# Patient Record
Sex: Male | Born: 1995 | Race: White | Marital: Single | Smoking: Never smoker
Health system: Southern US, Community
[De-identification: ages and names within clinical notes are randomized; demographics above are authoritative.]

---

## 2015-10-03 ENCOUNTER — Emergency Department
Admission: EM | Admit: 2015-10-03 | Discharge: 2015-10-03 | Disposition: A | Discharge status: Home / Self Care | Payer: Managed Care, Other (non HMO) | Attending: Emergency Medicine | Admitting: Emergency Medicine

## 2015-10-03 ENCOUNTER — Encounter: Payer: Self-pay | Admitting: Emergency Medicine

## 2015-10-03 ENCOUNTER — Emergency Department: Payer: Managed Care, Other (non HMO)

## 2015-10-03 DIAGNOSIS — S9002XA Contusion of left ankle, initial encounter: Secondary | ICD-10-CM | POA: Diagnosis not present

## 2015-10-03 DIAGNOSIS — W2103XA Struck by baseball, initial encounter: Secondary | ICD-10-CM | POA: Diagnosis not present

## 2015-10-03 DIAGNOSIS — S99912A Unspecified injury of left ankle, initial encounter: Secondary | ICD-10-CM | POA: Diagnosis present

## 2015-10-03 DIAGNOSIS — Y92320 Baseball field as the place of occurrence of the external cause: Secondary | ICD-10-CM | POA: Insufficient documentation

## 2015-10-03 DIAGNOSIS — Y9364 Activity, baseball: Secondary | ICD-10-CM | POA: Diagnosis not present

## 2015-10-03 DIAGNOSIS — Z880 Allergy status to penicillin: Secondary | ICD-10-CM | POA: Diagnosis not present

## 2015-10-03 DIAGNOSIS — Y998 Other external cause status: Secondary | ICD-10-CM | POA: Diagnosis not present

## 2015-10-03 MED ORDER — HYDROCODONE-ACETAMINOPHEN 5-325 MG PO TABS: 1.0000 | ORAL_TABLET | ORAL | Status: AC | PRN

## 2015-10-03 MED ORDER — IBUPROFEN 600 MG PO TABS
600.0000 mg | ORAL_TABLET | Freq: Three times a day (TID) | ORAL | Status: AC | PRN
Start: 2015-10-03 — End: ?

## 2015-10-03 NOTE — ED Provider Notes (Signed)
[facility] Emergency Department Provider Note ____________________________________________  Time seen: Approximately 7:24 PM  I have reviewed the triage vital signs and the nursing notes.   HISTORY  Chief Complaint Ankle Pain   HPI Patrick Erickson is a 20 y.o. male is here with complaint of left ankle pain. Patient states that he was hit in the ankle by a thrown baseball at approximately 2 PM and has had pain since that time. He did take ibuprofen prior to his arrival in the emergency room. Currently his pain is 2 out of 10. This was during a [employer] baseball game.   History reviewed. No pertinent past medical history.  There are no active problems to display for this patient.   History reviewed. No pertinent past surgical history.  Current Outpatient Rx  Name  Route  Sig  Dispense  Refill  . HYDROcodone-acetaminophen (NORCO/VICODIN) 5-325 MG tablet   Oral   Take 1 tablet by mouth every 4 (four) hours as needed for moderate pain.   12 tablet   0   . ibuprofen (ADVIL,MOTRIN) 600 MG tablet   Oral   Take 1 tablet (600 mg total) by mouth every 8 (eight) hours as needed.   30 tablet   0     Allergies Penicillins  No family history on file.  Social History Social History  Substance Use Topics  . Smoking status: Never Smoker   . Smokeless tobacco: None  . Alcohol Use: None    Review of Systems Constitutional: No fever/chills Cardiovascular: Denies chest pain. Respiratory: Denies shortness of breath. Musculoskeletal: Positive left ankle pain. Skin: Negative for rash. Neurological: Negative for  focal weakness or numbness.  10-point ROS otherwise negative.  ____________________________________________   PHYSICAL EXAM:  VITAL SIGNS: ED Triage Vitals  Enc Vitals Group     BP 10/03/15 1745 110/61 mmHg     Pulse Rate 10/03/15 1745 62     Resp 10/03/15 1745 18     Temp 10/03/15 1745 98.4 F (36.9 C)     Temp Source 10/03/15 1745 Oral      SpO2 10/03/15 1745 98 %     Weight 10/03/15 1745 140 lb (63.504 kg)     Height 10/03/15 1745 5\' 9"  (1.753 m)     Head Cir --      Peak Flow --      Pain Score 10/03/15 1747 2     Pain Loc --      Pain Edu? --      Excl. in GC? --     Constitutional: Alert and oriented. Well appearing and in no acute distress. Eyes: Conjunctivae are normal. PERRL. EOMI. Head: Atraumatic. Nose: No congestion/rhinnorhea. Neck: No stridor.   Cardiovascular: Normal rate, regular rhythm. Grossly normal heart sounds.  Good peripheral circulation. Respiratory: Normal respiratory effort.  No retractions. Lungs CTAB. Musculoskeletal left ankle there is soft tissue tenderness but no edema is appreciated. There is no ecchymosis. Range of motion is minimally restricted secondary to pain. Motor sensory function intact. Neurologic:  Normal speech and language. No gross focal neurologic deficits are appreciated. No gait instability. Skin:  Skin is warm, dry and intact. No rash noted. No erythema, ecchymosis, or abrasions were seen. Psychiatric: Mood and affect are normal. Speech and behavior are normal.  ____________________________________________   LABS (all labs ordered are listed, but only abnormal results are displayed)  Labs Reviewed - No data to display   RADIOLOGY Left ankle per radiologist is negative.  ____________________________________________  PROCEDURES  Procedure(s) performed: None  Critical Care performed: No  ____________________________________________   INITIAL IMPRESSION / ASSESSMENT AND PLAN / ED COURSE  Pertinent labs & imaging results that were available during my care of the patient were reviewed by me and considered in my medical decision making (see chart for details).  Ace wrap was applied and patient was given crutches. Patient was instructed to ice and elevate ankle and continue ibuprofen. He was given a limited amount of Norco if needed for severe  pain. ____________________________________________   FINAL CLINICAL IMPRESSION(S) / ED DIAGNOSES  Final diagnoses:  Contusion of left ankle, initial encounter      Tommi Rumps, PA-C 10/03/15 1943  Sharyn Creamer, MD 10/03/15 2358

## 2015-10-03 NOTE — ED Notes (Signed)
Hit in L ankle approx 2p with ball, pain since.

## 2015-10-03 NOTE — Discharge Instructions (Signed)
Contusion °A contusion is a deep bruise. Contusions happen when an injury causes bleeding under the skin. Symptoms of bruising include pain, swelling, and discolored skin. The skin may turn blue, purple, or yellow. °HOME CARE  °· Rest the injured area. °· If told, put ice on the injured area. °· Put ice in a plastic bag. °· Place a towel between your skin and the bag. °· Leave the ice on for 20 minutes, 2-3 times per day. °· If told, put light pressure (compression) on the injured area using an elastic bandage. Make sure the bandage is not too tight. Remove it and put it back on as told by your doctor. °· If possible, raise (elevate) the injured area above the level of your heart while you are sitting or lying down. °· Take over-the-counter and prescription medicines only as told by your doctor. °GET HELP IF: °· Your symptoms do not get better after several days of treatment. °· Your symptoms get worse. °· You have trouble moving the injured area. °GET HELP RIGHT AWAY IF:  °· You have very bad pain. °· You have a loss of feeling (numbness) in a hand or foot. °· Your hand or foot turns pale or cold. °  °This information is not intended to replace advice given to you by your health care provider. Make sure you discuss any questions you have with your health care provider. °  °Document Released: 12/28/2007 Document Revised: 04/01/2015 Document Reviewed: 11/26/2014 °Elsevier Interactive Patient Education ©2016 [employer] ° °Cryotherapy °Cryotherapy is when you put ice on your injury. Ice helps lessen pain and puffiness (swelling) after an injury. Ice works the best when you start using it in the first 24 to 48 hours after an injury. °HOME CARE °· Put a dry or damp towel between the ice pack and your skin. °· You may press gently on the ice pack. °· Leave the ice on for no more than 10 to 20 minutes at a time. °· Check your skin after 5 minutes to make sure your skin is okay. °· Rest at least 20 minutes between ice  pack uses. °· Stop using ice when your skin loses feeling (numbness). °· Do not use ice on someone who cannot tell you when it hurts. This includes small children and people with memory problems (dementia). °GET HELP RIGHT AWAY IF: °· You have white spots on your skin. °· Your skin turns blue or pale. °· Your skin feels waxy or hard. °· Your puffiness gets worse. °MAKE SURE YOU:  °· Understand these instructions. °· Will watch your condition. °· Will get help right away if you are not doing well or get worse. °  °This information is not intended to replace advice given to you by your health care provider. Make sure you discuss any questions you have with your health care provider. °  °Document Released: 12/28/2007 Document Revised: 10/03/2011 Document Reviewed: 03/03/2011 °Elsevier Interactive Patient Education ©2016 [employer] ° °

## 2016-12-30 IMAGING — CR DG ANKLE COMPLETE 3+V*L*
3 series · 3 of 3 positions shown · non-contrast
Comparison: None.

CLINICAL DATA: Ankle injury playing baseball earlier today. Hit and
ankle by a fast ball. Initial encounter.

EXAM:
LEFT ANKLE COMPLETE - 3+ VIEW

[ankle ap]
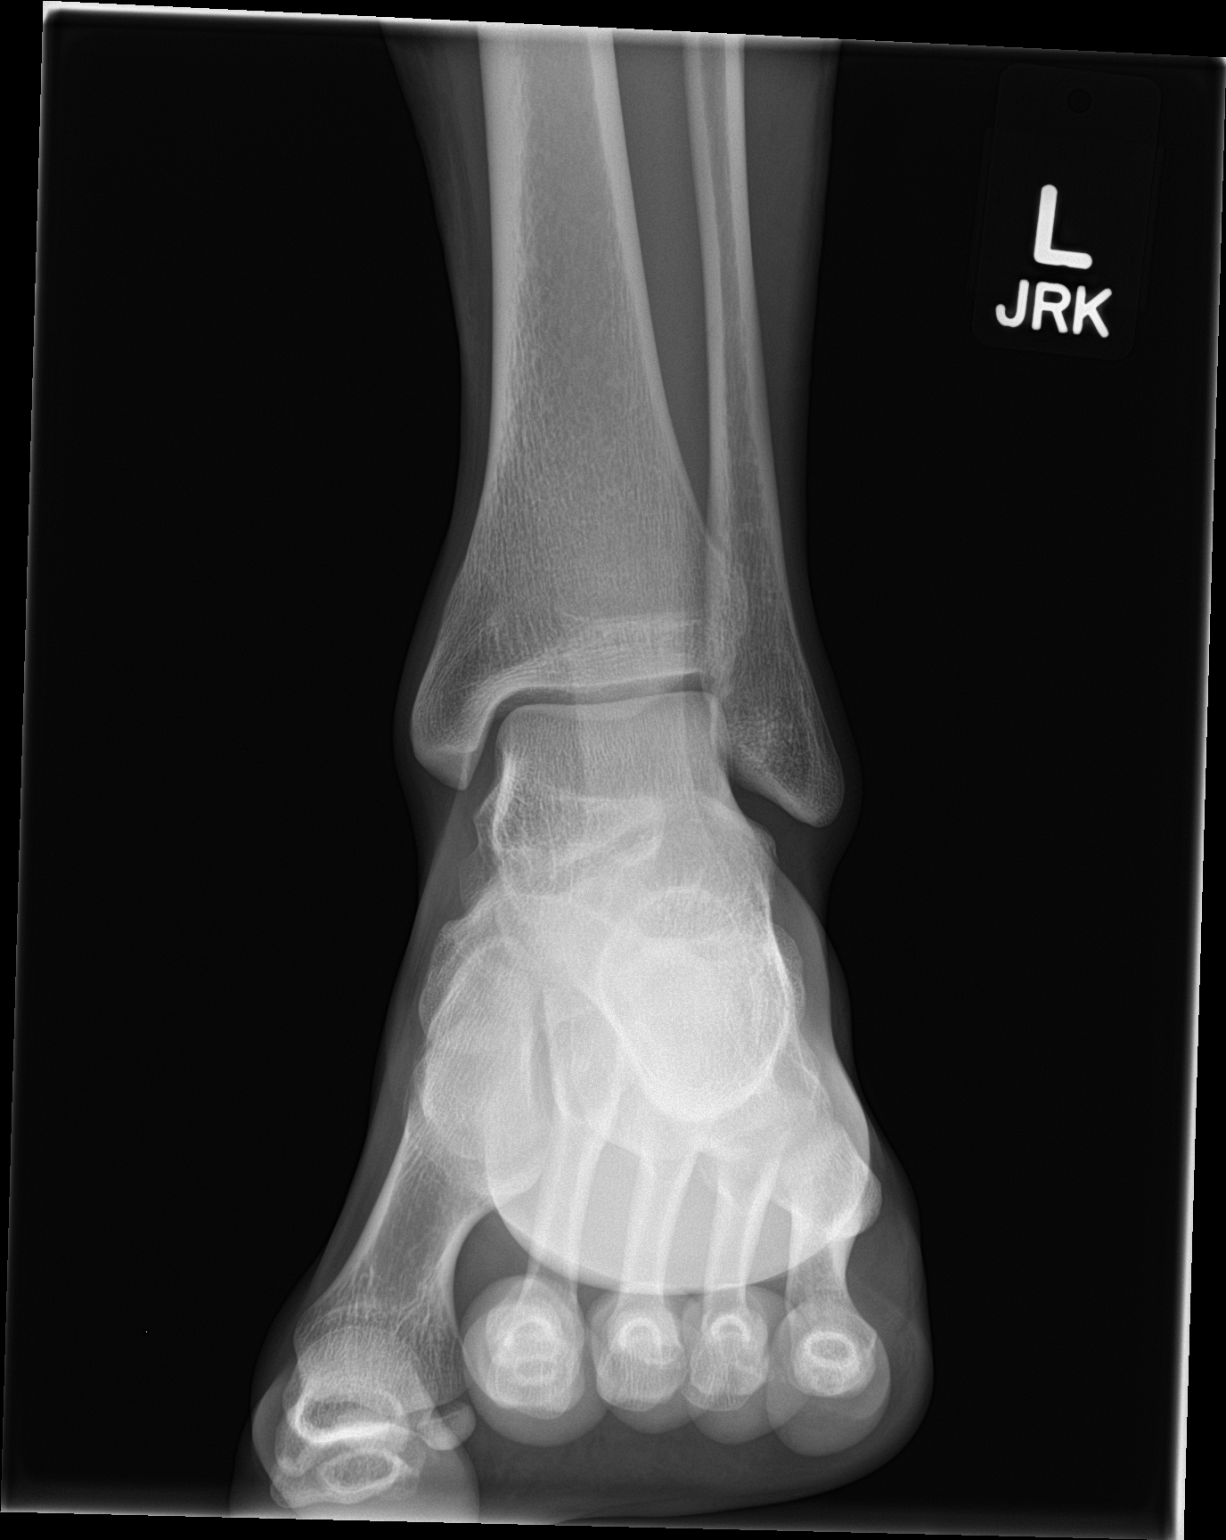

[ankle obl]
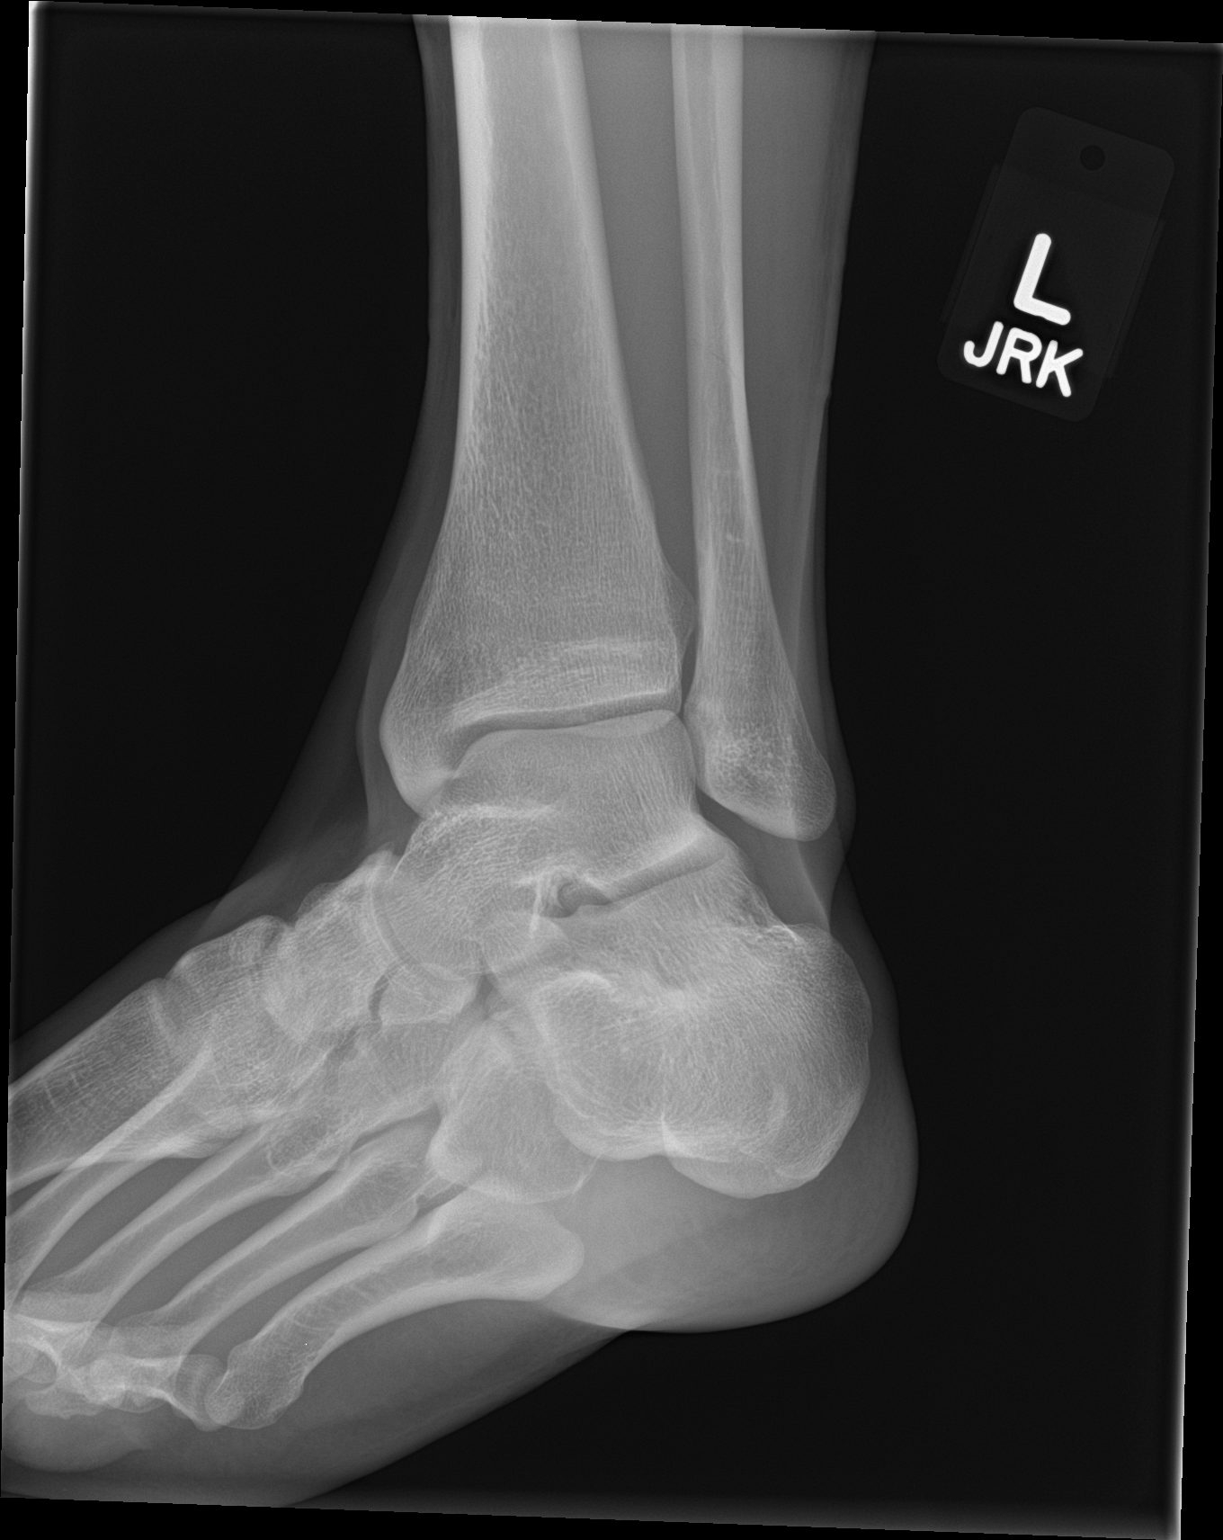

[ankle lat]
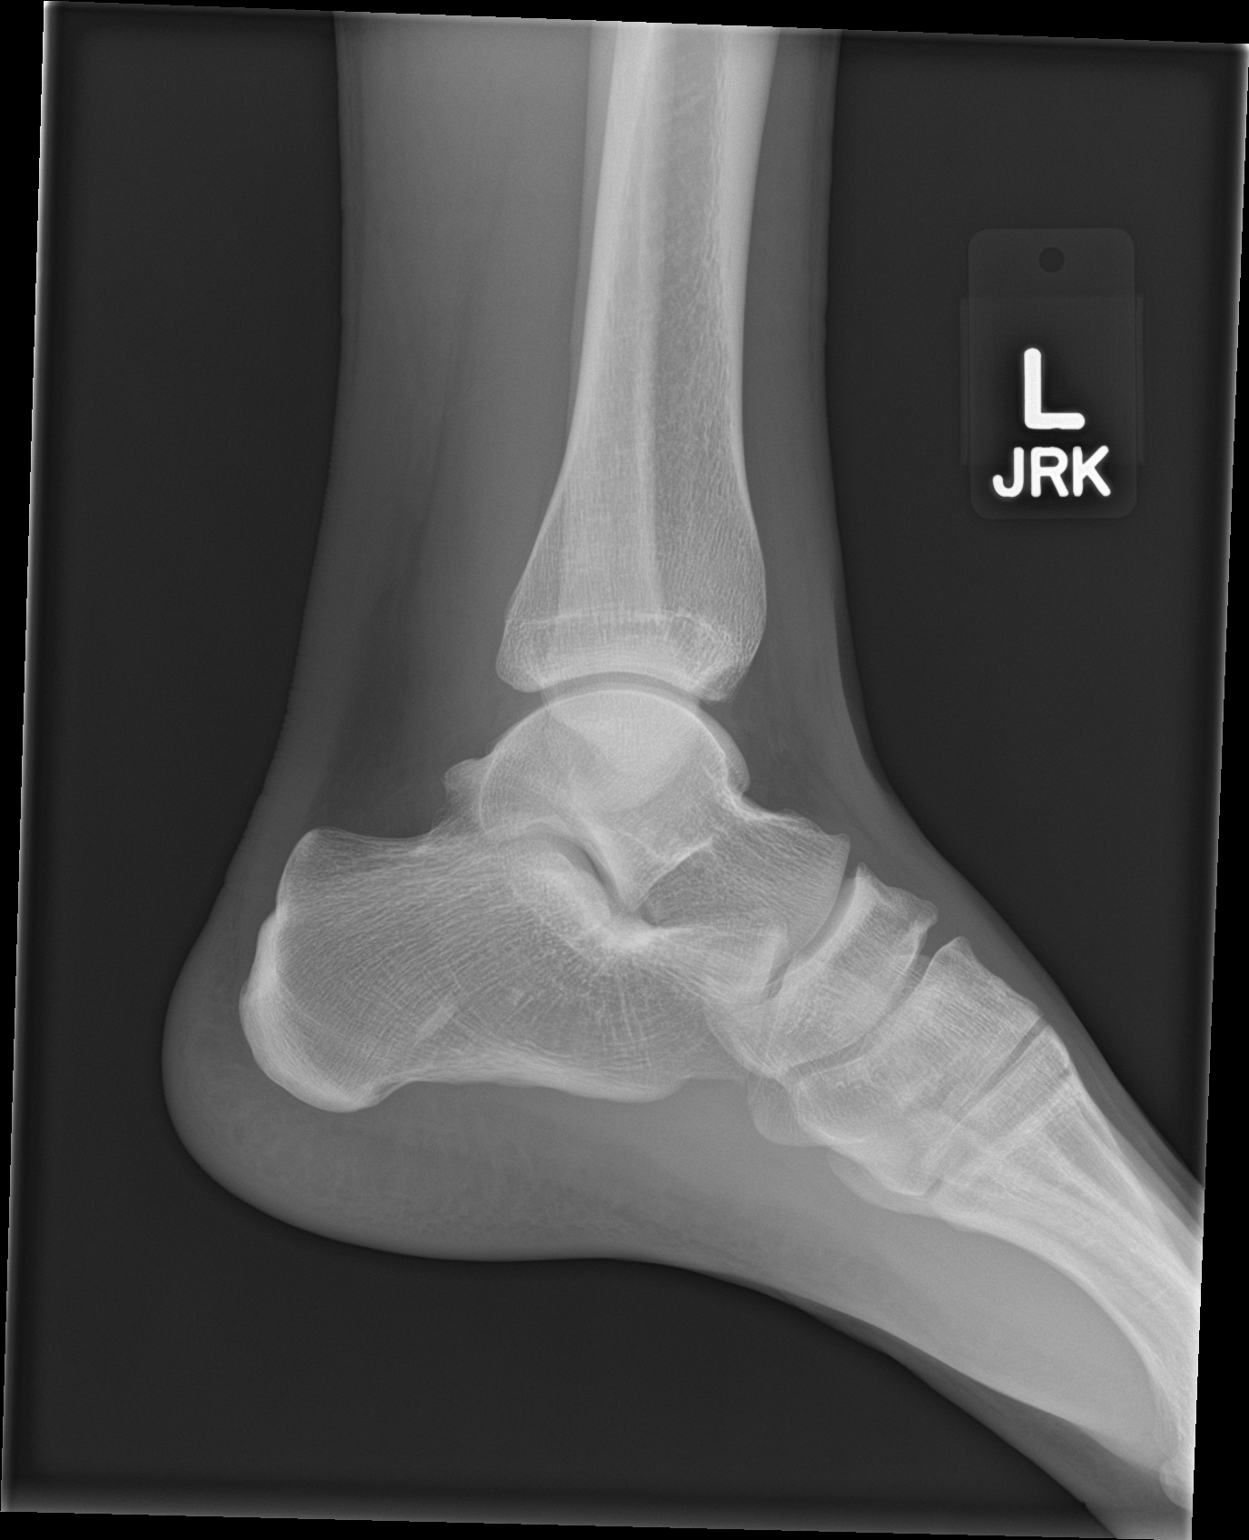

[3 of 3 positions shown; findings below may reference images not displayed]

FINDINGS: There is no evidence of fracture, dislocation, or joint effusion.
There is no evidence of arthropathy or other focal bone abnormality.
Soft tissues are unremarkable.
IMPRESSION: Negative.
